# Patient Record
Sex: Female | Born: 1960 | Race: White | Hispanic: No | Marital: Married | Smoking: Never smoker
Health system: Southern US, Community
[De-identification: ages and names within clinical notes are randomized; demographics above are authoritative.]

## PROBLEM LIST (undated history)

## (undated) DIAGNOSIS — F329 Major depressive disorder, single episode, unspecified: Secondary | ICD-10-CM

## (undated) DIAGNOSIS — F32A Depression, unspecified: Secondary | ICD-10-CM

## (undated) DIAGNOSIS — E7800 Pure hypercholesterolemia, unspecified: Secondary | ICD-10-CM

## (undated) HISTORY — PX: OTHER SURGICAL HISTORY: SHX169

---

## 2008-02-20 ENCOUNTER — Emergency Department (HOSPITAL_BASED_OUTPATIENT_CLINIC_OR_DEPARTMENT_OTHER): Admission: EM | Admit: 2008-02-20 | Discharge: 2008-02-20 | Payer: Self-pay | Admitting: Emergency Medicine

## 2011-01-25 LAB — COMPREHENSIVE METABOLIC PANEL
ALT: 10
Alkaline Phosphatase: 65
CO2: 30
Chloride: 103
GFR calc non Af Amer: >60
Glucose, Bld: 80
Potassium: 3.3 — ABNORMAL LOW
Sodium: 141
Total Protein: 7.6

## 2011-01-25 LAB — CBC
HCT: 42.1
Hemoglobin: 14.5
MCHC: 34.5
MCV: 89.7
Platelets: 211
RBC: 4.7
RDW: 13.1
WBC: 8.4

## 2011-01-25 LAB — POCT CARDIAC MARKERS
CKMB, poc: <1 — ABNORMAL LOW
CKMB, poc: <1 — ABNORMAL LOW
Myoglobin, poc: 19.4
Myoglobin, poc: 38.1
Troponin i, poc: <0.05

## 2011-01-25 LAB — ETHANOL: Alcohol, Ethyl (B): <10

## 2011-01-25 LAB — DIFFERENTIAL
Basophils Absolute: 0.1
Basophils Relative: 1
Eosinophils Absolute: 0
Eosinophils Relative: 0
Lymphocytes Relative: 23
Lymphs Abs: 1.9
Monocytes Absolute: 0.4
Monocytes Relative: 4
Neutro Abs: 6
Neutrophils Relative %: 71

## 2011-01-25 LAB — POCT TOXICOLOGY PANEL

## 2012-02-03 ENCOUNTER — Emergency Department (HOSPITAL_BASED_OUTPATIENT_CLINIC_OR_DEPARTMENT_OTHER)
Admission: EM | Admit: 2012-02-03 | Discharge: 2012-02-03 | Disposition: A | Discharge status: Home / Self Care | Payer: BC Managed Care – PPO | Attending: Emergency Medicine | Admitting: Emergency Medicine

## 2012-02-03 ENCOUNTER — Encounter (HOSPITAL_BASED_OUTPATIENT_CLINIC_OR_DEPARTMENT_OTHER): Payer: Self-pay | Admitting: *Deleted

## 2012-02-03 ENCOUNTER — Emergency Department (HOSPITAL_BASED_OUTPATIENT_CLINIC_OR_DEPARTMENT_OTHER): Payer: BC Managed Care – PPO

## 2012-02-03 DIAGNOSIS — E7800 Pure hypercholesterolemia, unspecified: Secondary | ICD-10-CM | POA: Insufficient documentation

## 2012-02-03 DIAGNOSIS — R112 Nausea with vomiting, unspecified: Secondary | ICD-10-CM | POA: Insufficient documentation

## 2012-02-03 DIAGNOSIS — F329 Major depressive disorder, single episode, unspecified: Secondary | ICD-10-CM | POA: Insufficient documentation

## 2012-02-03 DIAGNOSIS — F3289 Other specified depressive episodes: Secondary | ICD-10-CM | POA: Insufficient documentation

## 2012-02-03 DIAGNOSIS — R109 Unspecified abdominal pain: Secondary | ICD-10-CM

## 2012-02-03 DIAGNOSIS — R1084 Generalized abdominal pain: Secondary | ICD-10-CM | POA: Insufficient documentation

## 2012-02-03 HISTORY — DX: Major depressive disorder, single episode, unspecified: F32.9

## 2012-02-03 HISTORY — DX: Pure hypercholesterolemia, unspecified: E78.00

## 2012-02-03 HISTORY — DX: Depression, unspecified: F32.A

## 2012-02-03 LAB — CBC WITH DIFFERENTIAL/PLATELET
Basophils Absolute: 0 10*3/uL (ref 0.0–0.1)
Lymphocytes Relative: 24 % (ref 12–46)
Neutro Abs: 5.8 10*3/uL (ref 1.7–7.7)
Neutrophils Relative %: 70 % (ref 43–77)
Platelets: 295 10*3/uL (ref 150–400)
RDW: 13 % (ref 11.5–15.5)
WBC: 8.3 10*3/uL (ref 4.0–10.5)

## 2012-02-03 LAB — COMPREHENSIVE METABOLIC PANEL
ALT: 19 U/L (ref 0–35)
AST: 21 U/L (ref 0–37)
Albumin: 4.4 g/dL (ref 3.5–5.2)
CO2: 31 mEq/L (ref 19–32)
Chloride: 98 mEq/L (ref 96–112)
GFR calc non Af Amer: >90 mL/min (ref >90)
Sodium: 141 mEq/L (ref 135–145)
Total Bilirubin: 0.5 mg/dL (ref 0.3–1.2)

## 2012-02-03 LAB — URINALYSIS, ROUTINE W REFLEX MICROSCOPIC
Bilirubin Urine: NEGATIVE
Specific Gravity, Urine: 1.011 (ref 1.005–1.030)
Urobilinogen, UA: 0.2 mg/dL (ref 0.0–1.0)
pH: 8 (ref 5.0–8.0)

## 2012-02-03 LAB — URINE MICROSCOPIC-ADD ON

## 2012-02-03 MED ORDER — ONDANSETRON HCL 4 MG/2ML IJ SOLN
4.0000 mg | Freq: Once | INTRAMUSCULAR | Status: AC
  Administered 2012-02-03: 4 mg via INTRAVENOUS
  Filled 2012-02-03: qty 2

## 2012-02-03 MED ORDER — SODIUM CHLORIDE 0.9 % IV BOLUS (SEPSIS)
1000.0000 mL | Freq: Once | INTRAVENOUS | Status: AC
  Administered 2012-02-03: 1000 mL via INTRAVENOUS

## 2012-02-03 MED ORDER — MAGNESIUM CITRATE PO SOLN: 296.0000 mL | Freq: Once | ORAL | Status: AC

## 2012-02-03 MED ORDER — MORPHINE SULFATE 4 MG/ML IJ SOLN
2.0000 mg | Freq: Once | INTRAMUSCULAR | Status: AC
  Administered 2012-02-03: 2 mg via INTRAVENOUS
  Filled 2012-02-03: qty 1

## 2012-02-03 MED ORDER — IOHEXOL 300 MG/ML  SOLN
100.0000 mL | Freq: Once | INTRAMUSCULAR | Status: AC | PRN
  Administered 2012-02-03: 100 mL via INTRAVENOUS

## 2012-02-03 MED ORDER — PROMETHAZINE HCL 25 MG PO TABS: 25.0000 mg | ORAL_TABLET | Freq: Four times a day (QID) | ORAL | Status: AC | PRN

## 2012-02-03 MED ORDER — IOHEXOL 300 MG/ML  SOLN
25.0000 mL | INTRAMUSCULAR | Status: AC
  Administered 2012-02-03 (×2): 25 mL via ORAL

## 2012-02-03 NOTE — ED Notes (Signed)
Patient transported to CT 

## 2012-02-03 NOTE — ED Notes (Signed)
Abdominal pain. Was sent from her MDs office with constipation. Xray showed possible ileus.

## 2012-02-03 NOTE — ED Provider Notes (Signed)
History     CSN: [id number]  Arrival date & time 02/03/12  1722   First MD Initiated Contact with Patient 02/03/12 1901      Chief Complaint  Patient presents with  . Abdominal Pain    (Consider location/radiation/quality/duration/timing/severity/associated sxs/prior treatment) HPI Comments: Patient is an otherwise healthy 51 year old female who presents with abdominal pain that started about 1 week ago. Patient reports a gradual onset of pain that is progressively worsening. She describes the pain as discomfort and fullness. The pain is generalized to her abdomen and does not radiate. She reports associated nausea and vomiting -- she reports vomiting 9 times today. She reports having scant bowel movements and feels constipated. Nothing makes the pain better or worse. She denies fever, chest pain, SOB, headache, back pain, dysuria.   Patient is a 51 y.o. female presenting with abdominal pain.  Abdominal Pain The primary symptoms of the illness include abdominal pain, nausea and vomiting.    Past Medical History  Diagnosis Date  . Depression   . High cholesterol     Past Surgical History  Procedure Date  . C sections     No family history on file.  History  Substance Use Topics  . Smoking status: Never Smoker   . Smokeless tobacco: Not on file  . Alcohol Use: No    OB History    Grav Para Term Preterm Abortions TAB SAB Ect Mult Living                  Review of Systems  Constitutional: Positive for appetite change.  Gastrointestinal: Positive for nausea, vomiting and abdominal pain.  All other systems reviewed and are negative.    Allergies  Review of patient's allergies indicates no known allergies.  Home Medications  No current outpatient prescriptions on file.  BP 173/93  Pulse 72  Temp 98.1 F (36.7 C) (Oral)  Resp 20  SpO2 100%  Physical Exam  Nursing note and vitals reviewed. Constitutional: She is oriented to person, place, and time. She  appears well-developed and well-nourished. No distress.  HENT:  Head: Normocephalic and atraumatic.  Mouth/Throat: No oropharyngeal exudate.  Eyes: Conjunctivae normal and EOM are normal. No scleral icterus.  Neck: Normal range of motion. Neck supple.  Cardiovascular: Normal rate and regular rhythm.  Exam reveals no gallop and no friction rub.   No murmur heard. Pulmonary/Chest: Effort normal and breath sounds normal. She has no wheezes. She has no rales. She exhibits no tenderness.  Abdominal: Soft. She exhibits no distension. There is tenderness. There is no rebound and no guarding.       Generalized tenderness to palpation. No peritoneal signs.   Musculoskeletal: Normal range of motion.  Neurological: She is alert and oriented to person, place, and time. Coordination normal.       Speech is goal-oriented. Moves limbs without ataxia.   Skin: Skin is warm and dry. She is not diaphoretic.  Psychiatric: She has a normal mood and affect. Her behavior is normal.    ED Course  Procedures (including critical care time)  Labs Reviewed  URINALYSIS, ROUTINE W REFLEX MICROSCOPIC - Abnormal; Notable for the following:    APPearance TURBID (*)     Hgb urine dipstick SMALL (*)     Ketones, ur 15 (*)     Leukocytes, UA MODERATE (*)     All other components within normal limits  URINE MICROSCOPIC-ADD ON - Abnormal; Notable for the following:    Squamous  Epithelial / LPF FEW (*)     Bacteria, UA FEW (*)     All other components within normal limits  COMPREHENSIVE METABOLIC PANEL - Abnormal; Notable for the following:    Glucose, Bld 108 (*)     Calcium 10.7 (*)     All other components within normal limits  CBC WITH DIFFERENTIAL   Ct Abdomen Pelvis W Contrast  02/03/2012  *RADIOLOGY REPORT*  Clinical Data: Right lower quadrant pain.  Vomiting.  Mild leukocytosis.  CT ABDOMEN AND PELVIS WITH CONTRAST  Technique:  Multidetector CT imaging of the abdomen and pelvis was performed following the  standard protocol during bolus administration of intravenous contrast.  Contrast: OMNIPAQUE IOHEXOL 300 MG/ML  SOLN  Comparison: None.  Findings: Appendix is not well visualized, however there is no evidence of inflammatory process in the area the cecum were elsewhere within the abdomen or pelvis.  Mild sigmoid diverticulosis is noted, however there is no evidence of diverticulitis.  Uterus and adnexa are unremarkable. No abnormal fluid collections are identified.  A nonobstructing 3 mm calculus is seen in the lower pole of the left kidney.  No evidence of ureteral calculi or hydronephrosis. No bladder calculi identified.  A small renal cysts are noted bilaterally but there is no evidence of renal mass.  A heterogeneous mass is seen in the posterior right hepatic lobe measuring approximately centimeters.  This is nonspecific features. No other liver masses are identified.  The gallbladder, pancreas, spleen, and adrenal glands are normal in appearance.  IMPRESSION:  1.  No evidence of appendicitis or other acute findings. 2.  Tiny nonobstructing left renal calculus. 3.  Mild sigmoid diverticulosis.  No radiographic evidence of diverticulitis. 4.  3 cm indeterminate mass in the posterior segment right hepatic lobe. Non-emergent abdomen MRI without and with contrast is recommended for further characterization. This recommendation follows ACR consensus guidelines:  Managing Incidental Findings on Abdominal CT:  White Paper of the ACR Incidental Findings Committee.  J Am Coll Radiol 1914;7:829-562   Original Report Authenticated By: Danae Orleans, M.D.      1. Abdominal pain       MDM  9:38 PM Patient given morphine and zofran for pain and nausea. CT scan shows no evidence of obstruction, infectious process or any other acute abnormality.   9:58 PM Patient is feeling better and is ready to go home. No further evaluation needed at this time. I will discharge her with Magnesium citrate for constipation  and phenergan for nausea. She has been instructed to follow up with her PCP about the incidental liver mass found on CT. She should follow up with her PCP for further evaluation. She should return to the ED with worsening or concerning symptoms. Patient is currently stable.  Filed Vitals:   02/03/12 2000  BP: 173/93  Pulse: 72  Temp:   Resp:          Emilia Beck, PA-C 02/03/12 2213

## 2012-02-03 NOTE — ED Provider Notes (Signed)
Medical screening examination/treatment/procedure(s) were performed by non-physician practitioner and as supervising physician I was immediately available for consultation/collaboration.   Rolan Bucco, MD 02/03/12 [phone]

## 2013-08-18 ENCOUNTER — Emergency Department (HOSPITAL_BASED_OUTPATIENT_CLINIC_OR_DEPARTMENT_OTHER)
Admission: EM | Admit: 2013-08-18 | Discharge: 2013-08-19 | Disposition: A | Discharge status: Home / Self Care | Payer: BC Managed Care – PPO | Attending: Emergency Medicine | Admitting: Emergency Medicine

## 2013-08-18 ENCOUNTER — Emergency Department (HOSPITAL_BASED_OUTPATIENT_CLINIC_OR_DEPARTMENT_OTHER): Payer: BC Managed Care – PPO

## 2013-08-18 ENCOUNTER — Encounter (HOSPITAL_BASED_OUTPATIENT_CLINIC_OR_DEPARTMENT_OTHER): Payer: Self-pay | Admitting: Emergency Medicine

## 2013-08-18 DIAGNOSIS — Z8639 Personal history of other endocrine, nutritional and metabolic disease: Secondary | ICD-10-CM | POA: Insufficient documentation

## 2013-08-18 DIAGNOSIS — R1012 Left upper quadrant pain: Secondary | ICD-10-CM | POA: Insufficient documentation

## 2013-08-18 DIAGNOSIS — N390 Urinary tract infection, site not specified: Secondary | ICD-10-CM | POA: Insufficient documentation

## 2013-08-18 DIAGNOSIS — Z862 Personal history of diseases of the blood and blood-forming organs and certain disorders involving the immune mechanism: Secondary | ICD-10-CM | POA: Insufficient documentation

## 2013-08-18 DIAGNOSIS — R079 Chest pain, unspecified: Secondary | ICD-10-CM

## 2013-08-18 DIAGNOSIS — R0789 Other chest pain: Secondary | ICD-10-CM | POA: Insufficient documentation

## 2013-08-18 DIAGNOSIS — Z8659 Personal history of other mental and behavioral disorders: Secondary | ICD-10-CM | POA: Insufficient documentation

## 2013-08-18 LAB — D-DIMER, QUANTITATIVE: D-Dimer, Quant: 0.31 ug/mL-FEU (ref 0.00–0.48)

## 2013-08-18 LAB — CBC
HEMATOCRIT: 42.4 % (ref 36.0–46.0)
Hemoglobin: 14.3 g/dL (ref 12.0–15.0)
MCH: 30.9 pg (ref 26.0–34.0)
MCHC: 33.7 g/dL (ref 30.0–36.0)
MCV: 91.6 fL (ref 78.0–100.0)
PLATELETS: 276 10*3/uL (ref 150–400)
RBC: 4.63 MIL/uL (ref 3.87–5.11)
RDW: 13.3 % (ref 11.5–15.5)
WBC: 8.3 10*3/uL (ref 4.0–10.5)

## 2013-08-18 NOTE — ED Provider Notes (Signed)
CSN: [id number]     Arrival date & time 08/18/13  2246 History  This chart was scribed for Enid SkeensJoshua M Derrian Rodak, MD by Danella Maiersaroline Early, ED Scribe. This patient was seen in room MH03/MH03 and the patient's care was started at 11:03 PM.    Chief Complaint  Patient presents with  . Chest Pain   The history is provided by the patient. No language interpreter was used.   HPI Comments: Ann Harrington is a 53 y.o. female with a h/o hypercholesterolemia who presents to the Emergency Department complaining of sharp left-sided CP with movement, palpation and deep breathing onset last night that worsened tonight. She describes the pain as "like a knife". She denies prior h/o CP. No family h/o early cardiac death. She is not on any medications regularly. No h/o DVT or PE, hernias, or cancer. She denies recent travels or long car trips. Pt denies nausea, vomiting, diaphoresis, abdominal pain, HA, blurred vision, rashes, leg swelling, hemoptysis.   Past Medical History  Diagnosis Date  . Depression   . High cholesterol    Past Surgical History  Procedure Laterality Date  . C sections     No family history on file. History  Substance Use Topics  . Smoking status: Never Smoker   . Smokeless tobacco: Not on file  . Alcohol Use: No   OB History   Grav Para Term Preterm Abortions TAB SAB Ect Mult Living                 Review of Systems  Constitutional: Negative for diaphoresis.  Eyes: Negative for visual disturbance.  Cardiovascular: Positive for chest pain. Negative for leg swelling.  Gastrointestinal: Negative for nausea and vomiting.  Skin: Negative for rash.  Neurological: Negative for headaches.  All other systems reviewed and are negative.     Allergies  Review of patient's allergies indicates no known allergies.  Home Medications   Prior to Admission medications   Medication Sig Start Date End Date Taking? Authorizing Provider  magnesium citrate solution Take 296 mLs by mouth  once. OTC 02/03/12   Kaitlyn Szekalski, PA-C  promethazine (PHENERGAN) 25 MG tablet Take 1 tablet (25 mg total) by mouth every 6 (six) hours as needed for nausea. 02/03/12   Kaitlyn Szekalski, PA-C   BP 144/93  Pulse 84  Temp(Src) 98.3 F (36.8 C) (Oral)  Resp 20  Ht 4\' 10"  (1.473 m)  Wt 150 lb (68.04 kg)  BMI 31.36 kg/m2  SpO2 99% Physical Exam  Nursing note and vitals reviewed. Constitutional: She is oriented to person, place, and time. She appears well-developed and well-nourished. No distress.  HENT:  Head: Normocephalic and atraumatic.  Mouth/Throat: Oropharynx is clear and moist.  Eyes: Conjunctivae and EOM are normal. Pupils are equal, round, and reactive to light.  Neck: Neck supple. No tracheal deviation present.  Cardiovascular: Normal rate, regular rhythm and normal heart sounds.  Exam reveals no gallop and no friction rub.   No murmur heard. Pulmonary/Chest: Effort normal and breath sounds normal. No respiratory distress. She has no wheezes. She has no rales. She exhibits tenderness (anterior ribs).  Pain with dep breathing  Abdominal: Soft. There is tenderness. There is no guarding.  Mild tenderness LUQ abdominal and left lower anterior chest with palpation  Musculoskeletal: Normal range of motion. She exhibits no edema.  Equal pulses BLE. No tenderness to back of calves.   Neurological: She is alert and oriented to person, place, and time.  Skin: Skin is warm and  dry. No rash noted.  Psychiatric: She has a normal mood and affect. Her behavior is normal.    ED Course  Procedures (including critical care time) Medications  ketorolac (TORADOL) 15 MG/ML injection 15 mg (15 mg Intravenous Given 08/19/13 0014)  fentaNYL (SUBLIMAZE) injection 50 mcg (50 mcg Intravenous Given 08/19/13 0014)    DIAGNOSTIC STUDIES: Oxygen Saturation is 99% on RA, normal by my interpretation.    COORDINATION OF CARE: 11:27 PM- Discussed treatment plan with pt. Pt agrees to plan.  12:43  AM - Rechecked pt who states pain has improved but still present with deep breathing and movement/ palpation. Discussed test results which were all normal. Plan for second troponin and UA. Reproducible chest wall pain anterior left side. Abdomen on recheck is nontender, soft. Discussed likelihood that pain is muscular. Pt agrees to plan.     Labs Review Labs Reviewed  COMPREHENSIVE METABOLIC PANEL - Abnormal; Notable for the following:    Glucose, Bld 108 (*)    Total Bilirubin <0.2 (*)    GFR calc non Af Amer 73 (*)    GFR calc Af Amer 84 (*)    All other components within normal limits  URINALYSIS, ROUTINE W REFLEX MICROSCOPIC - Abnormal; Notable for the following:    APPearance TURBID (*)    Leukocytes, UA LARGE (*)    All other components within normal limits  URINE MICROSCOPIC-ADD ON - Abnormal; Notable for the following:    Bacteria, UA FEW (*)    All other components within normal limits  URINE CULTURE  CBC  TROPONIN I  D-DIMER, QUANTITATIVE  TROPONIN I  LIPASE, BLOOD    Imaging Review Dg Chest 2 View  08/18/2013   CLINICAL DATA:  Left-sided chest pain, worse with movement.  EXAM: CHEST  2 VIEW  COMPARISON:  None.  FINDINGS: The lungs are well-aerated. Minimal left basilar opacity likely reflects atelectasis. There is no evidence of pleural effusion or pneumothorax.  The heart is normal in size; the mediastinal contour is within normal limits. No acute osseous abnormalities are seen.  IMPRESSION: Minimal left basilar opacity likely reflects atelectasis; lungs otherwise clear.   Electronically Signed   By: Roanna Raider M.D.   On: 08/18/2013 23:19     EKG Interpretation   Date/Time:  Sunday August 18 2013 22:51:34 EDT Ventricular Rate:  78 PR Interval:  146 QRS Duration: 88 QT Interval:  366 QTC Calculation: 417 R Axis:   87 Text Interpretation:  Normal sinus rhythm Normal ECG Confirmed by Saim Almanza   MD, Niasha Devins ([id number]) on 08/19/2013 12:41:56 AM      MDM   Final  diagnoses:  Chest pain  UTI (lower urinary tract infection)   I personally performed the services described in this documentation, which was scribed in my presence. The recorded information has been reviewed and is accurate.  Well-appearing healthy female with cholesterol is only risk factor for cardiac and no risk factors for pulmonary embolism. Pain is reproducible on multiple exams. Patient has exquisite tenderness to left lower anterior ribs. Patient does exercise regularly with significant other. D-dimer done for low risk pulmonary embolism and negative, no indication for CT scan. Delta troponin done for low risk cardiac negative and no reason for admission, discussed close outpatient followup and possible stress test if this atypical chest pain does not improve. Urine done in case pain related to kidney stone and no hematuria, mild UTI. Cipro and culture done with outpatient followup. Patient improved on recheck. No flank pain  on exam.  Results and differential diagnosis were discussed with the patient. Close follow up outpatient was discussed, patient comfortable with the plan.   Filed Vitals:   08/18/13 2257  BP: 144/93  Pulse: 84  Temp: 98.3 F (36.8 C)  TempSrc: Oral  Resp: 20  Height: 4\' 10"  (1.473 m)  Weight: 150 lb (68.04 kg)  SpO2: 99%    \   Enid SkeensJoshua M Jaquari Reckner, MD 08/19/13 [phone]

## 2013-08-18 NOTE — ED Notes (Signed)
C/o onset of left sided chest pain that is worsened with movement.  Denies nausea, vomiting, or diaphoresis.  No prior history of symptoms like this.

## 2013-08-19 LAB — URINE MICROSCOPIC-ADD ON

## 2013-08-19 LAB — COMPREHENSIVE METABOLIC PANEL
ALBUMIN: 4 g/dL (ref 3.5–5.2)
ALK PHOS: 97 U/L (ref 39–117)
ALT: 20 U/L (ref 0–35)
AST: 19 U/L (ref 0–37)
BUN: 14 mg/dL (ref 6–23)
CHLORIDE: 103 meq/L (ref 96–112)
CO2: 32 meq/L (ref 19–32)
Calcium: 9.8 mg/dL (ref 8.4–10.5)
Creatinine, Ser: 0.9 mg/dL (ref 0.50–1.10)
GFR calc Af Amer: 84 mL/min — ABNORMAL LOW (ref >90)
GFR calc non Af Amer: 73 mL/min — ABNORMAL LOW (ref >90)
Glucose, Bld: 108 mg/dL — ABNORMAL HIGH (ref 70–99)
POTASSIUM: 3.9 meq/L (ref 3.7–5.3)
Sodium: 146 mEq/L (ref 137–147)
Total Protein: 7.4 g/dL (ref 6.0–8.3)

## 2013-08-19 LAB — URINALYSIS, ROUTINE W REFLEX MICROSCOPIC
BILIRUBIN URINE: NEGATIVE
Glucose, UA: NEGATIVE mg/dL
Hgb urine dipstick: NEGATIVE
KETONES UR: NEGATIVE mg/dL
NITRITE: NEGATIVE
Protein, ur: NEGATIVE mg/dL
SPECIFIC GRAVITY, URINE: 1.018 (ref 1.005–1.030)
UROBILINOGEN UA: 0.2 mg/dL (ref 0.0–1.0)
pH: 8 (ref 5.0–8.0)

## 2013-08-19 LAB — TROPONIN I
Troponin I: <0.3 ng/mL (ref <0.30)
Troponin I: <0.3 ng/mL (ref <0.30)

## 2013-08-19 LAB — LIPASE, BLOOD: LIPASE: 38 U/L (ref 11–59)

## 2013-08-19 MED ORDER — KETOROLAC TROMETHAMINE 15 MG/ML IJ SOLN
15.0000 mg | Freq: Once | INTRAMUSCULAR | Status: AC
  Administered 2013-08-19: 15 mg via INTRAVENOUS
  Filled 2013-08-19: qty 1

## 2013-08-19 MED ORDER — CIPROFLOXACIN HCL 250 MG PO TABS: 250.0000 mg | ORAL_TABLET | Freq: Two times a day (BID) | ORAL | Status: AC

## 2013-08-19 MED ORDER — FENTANYL CITRATE 0.05 MG/ML IJ SOLN
50.0000 ug | Freq: Once | INTRAMUSCULAR | Status: AC
  Administered 2013-08-19: 50 ug via INTRAVENOUS
  Filled 2013-08-19: qty 2

## 2013-08-19 NOTE — Discharge Instructions (Signed)
If you were given medicines take as directed.  If you are on coumadin or contraceptives realize their levels and effectiveness is altered by many different medicines.  If you have any reaction (rash, tongues swelling, other) to the medicines stop taking and see a physician.   If no improvement in symptoms discussed CT scan and or stress test with her primary doctor or return to the ER. Please follow up as directed and return to the ER or see a physician for new or worsening symptoms.  Thank you. Filed Vitals:   08/18/13 2257  BP: 144/93  Pulse: 84  Temp: 98.3 F (36.8 C)  TempSrc: Oral  Resp: 20  Height: 4\' 10"  (1.473 m)  Weight: 150 lb (68.04 kg)  SpO2: 99%

## 2013-08-20 LAB — URINE CULTURE: Colony Count: 80000

## 2013-08-21 NOTE — Progress Notes (Signed)
ED Antimicrobial Stewardship Positive Culture Follow Up   Evaline Cotham is an 53 y.o. female who [facility] with a chief complaint of CP Chief Complaint  Patient presents with  . Chest Pain    Recent Results (from the past 720 hour(s))  URINE CULTURE     Status: None   Collection Time    08/19/13 12:56 AM      Result Value Ref Range Status   Specimen Description URINE, CLEAN CATCH   Final   Special Requests NONE   Final   Culture  Setup Time     Final   Value: 08/19/2013 09:58     Performed at [facility]   Colony Count     Final   Value: 80,000 COLONIES/ML     [facility]formed at [facility]   Culture     Final   Value: VIRIDANS STREPTOCOCCUS     Performed at [facility]   Report Status 08/20/2013 FINAL   Final    [x]  Treated with Cipro, organism not covered by prescribed antimicrobial  52 YOF who presented to the [facility] with c/o CP. Cardiac work-up was negative however work-up did show a mild UTI and the patient was sent home on Cipro. Cx grew viridans Strep which is not covered by Cipro. The patient has no red flags for IE - so will treat solely for UTI.  New antibiotic prescription: Keflex 500 mg bid x 7 days  ED Provider: Mellody DrownLauren Parker, PA-C  Ann HeldElizabeth J Keiasia Christianson 08/21/2013, 9:20 AM Infectious Diseases Pharmacist Phone# [phone]

## 2013-08-23 ENCOUNTER — Telehealth (HOSPITAL_BASED_OUTPATIENT_CLINIC_OR_DEPARTMENT_OTHER): Payer: Self-pay | Admitting: Emergency Medicine

## 2013-08-23 NOTE — Telephone Encounter (Signed)
Post ED Visit - Positive Culture Follow-up: Successful Patient Follow-Up  Culture assessed and recommendations reviewed by: []  Wes Dulaney, Pharm.D., BCPS []  Celedonio MiyamotoJeremy Frens, Pharm.D., BCPS [x]  Georgina PillionElizabeth Martin, Pharm.D., BCPS []  [place] Green[place]h Pham, Vermont[place]arm.D., BCPS, AAHIVP []  Estella HuskMichelle Turner, Pharm.D., BCPS, AAHIVP  Positive urine culture  []  Patient discharged without antimicrobial prescription and treatment is now indicated [x]  Organism is resistant to prescribed ED discharge antimicrobial []  Patient with positive blood cultures  Changes discussed with ED provider: Mellody DrownLauren Parker PA-C New antibiotic prescription: Keflex 500 mg BID x 7 days     [facility] Briceyda Abdullah 08/23/2013, 2:25 PM

## 2013-08-26 NOTE — Telephone Encounter (Signed)
Rx fr Keflex 500 mg PO BID x 7 days, prescribed by Mellody DrownLauren Parker PA-C, called in to [facility] in [place] [phone]) by Norm ParcelShannon Gammons PFM.

## 2013-09-29 IMAGING — CT CT ABD-PELV W/ CM
2 of 5 series · 16 of 46 positions shown, 18 images · IV contrast (APPLIED)
Comparison: None.

CLINICAL DATA: Right lower quadrant pain.  Vomiting.  Mild
leukocytosis.

CT ABDOMEN AND PELVIS WITH CONTRAST
TECHNIQUE: Multidetector CT imaging of the abdomen and pelvis was
performed following the standard protocol during bolus
administration of intravenous contrast.
Contrast: 100mL OMNIPAQUE IOHEXOL 300 MG/ML  SOLN

[Series 2: abd/pelvis 5.0 b31f · axial · 0.70mm/px · z∈[-425,-50]mm · 13 of 85 slices shown, 15 images]
[im 5/85  soft-tissue]
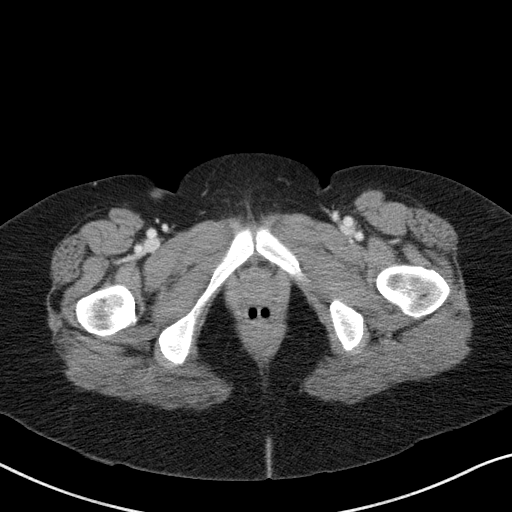
[im 5/85  bone]
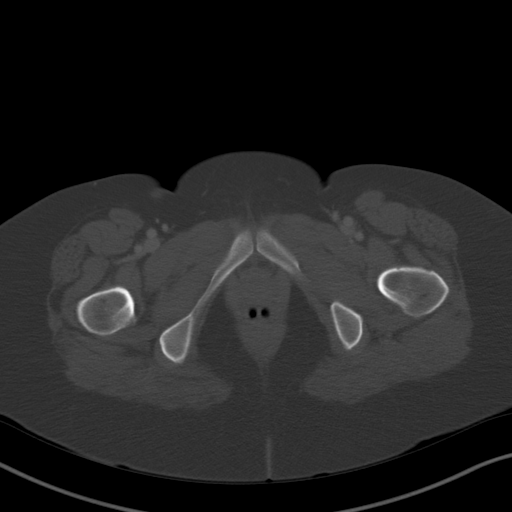
[im 14/85  soft-tissue]
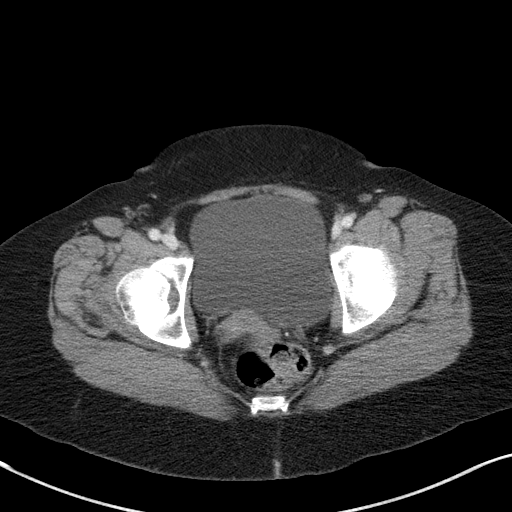
[im 18/85  soft-tissue]
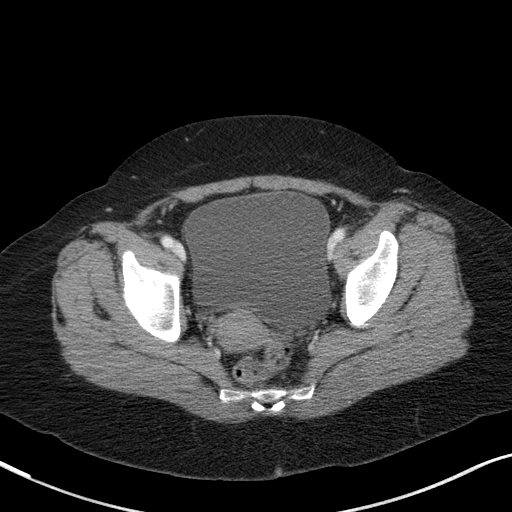
[im 23/85  soft-tissue]
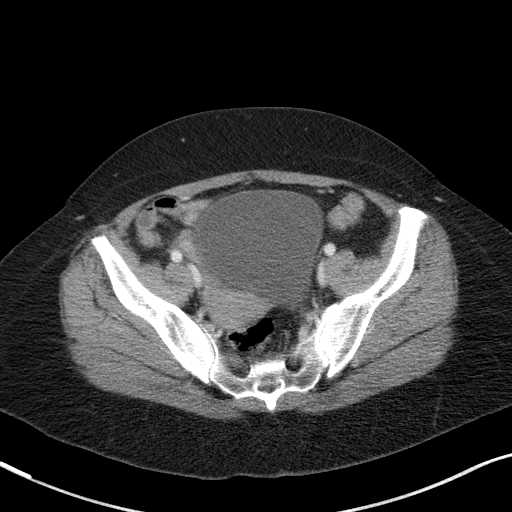
[im 31/85  soft-tissue]
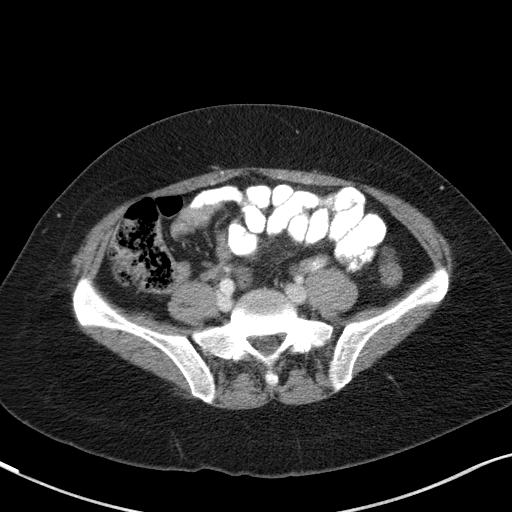
[im 36/85  soft-tissue]
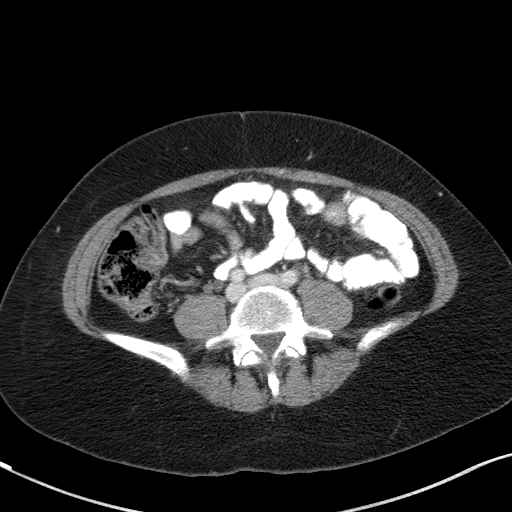
[im 45/85  soft-tissue]
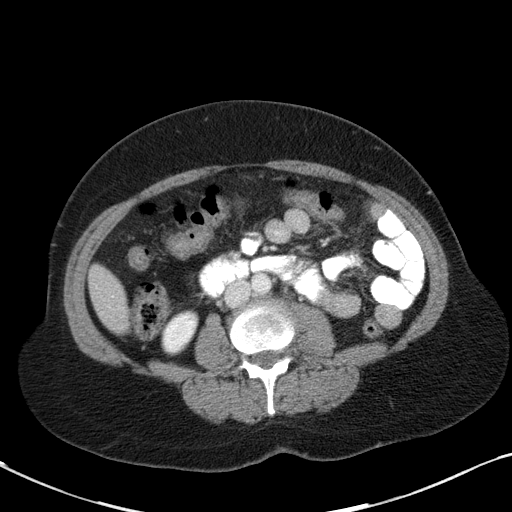
[im 49/85  soft-tissue]
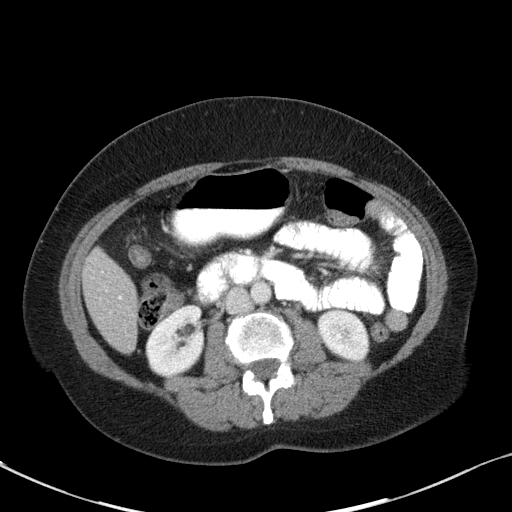
[im 54/85  soft-tissue]
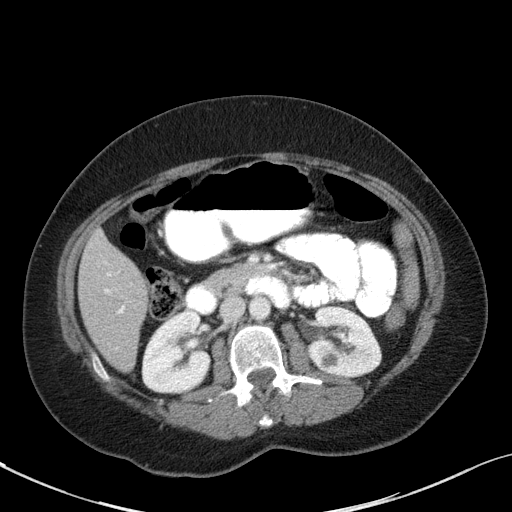
[im 54/85  bone]
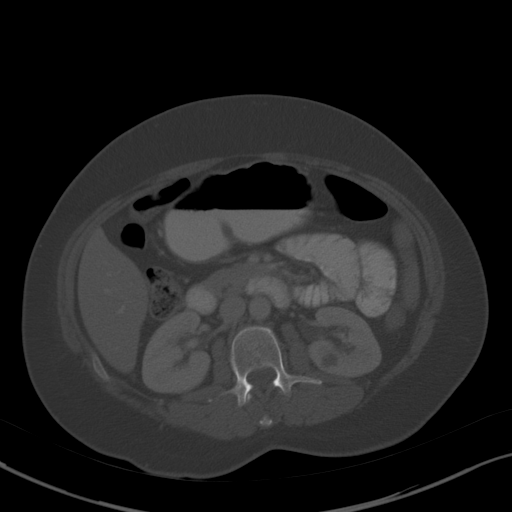
[im 62/85  soft-tissue]
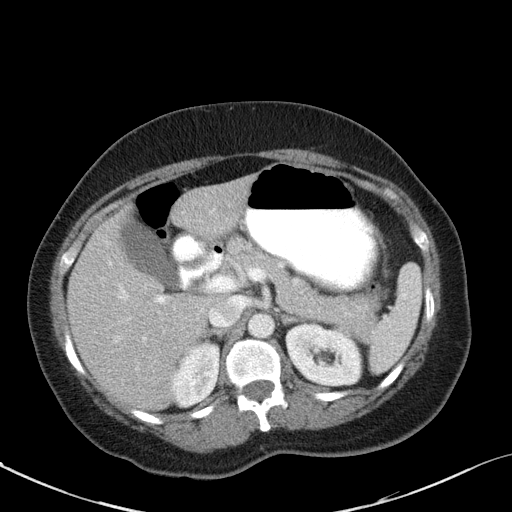
[im 67/85  soft-tissue]
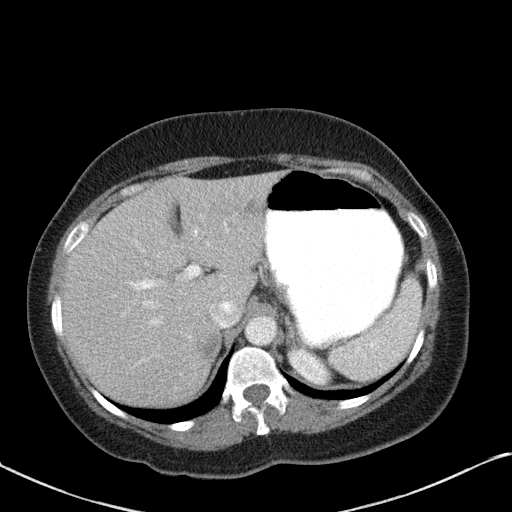
[im 71/85  soft-tissue]
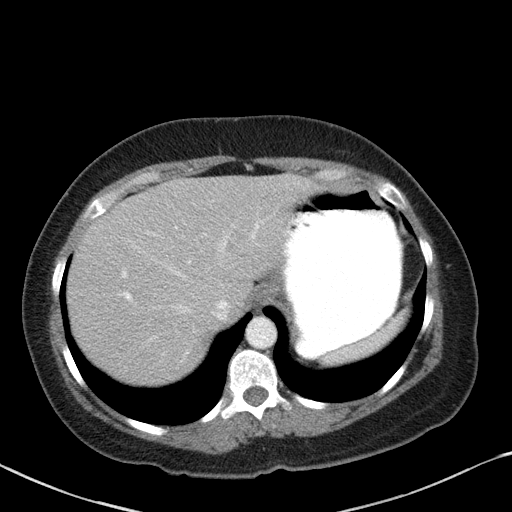
[im 80/85  soft-tissue]
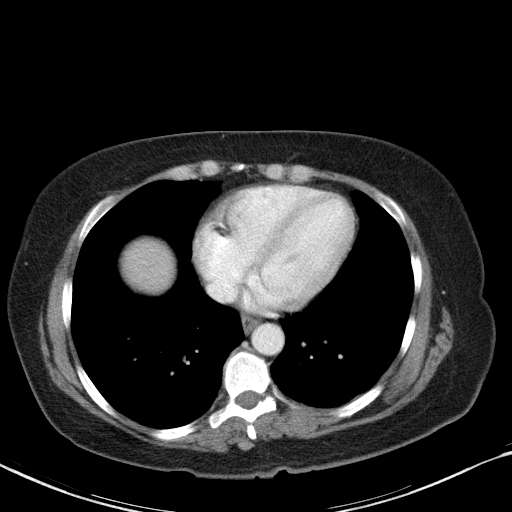

[Series 5: abd/pelvis 3.0 coronal · coronal · 0.77mm/px · 3 of 69 slices shown]
[im 23/69  soft-tissue]
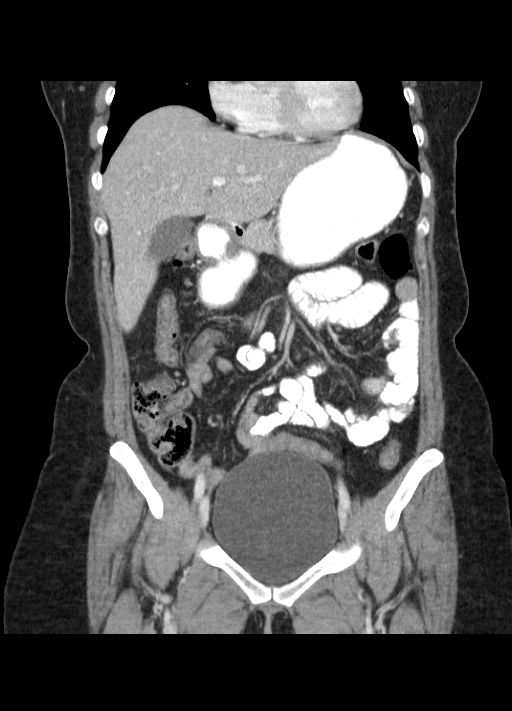
[im 31/69  soft-tissue]
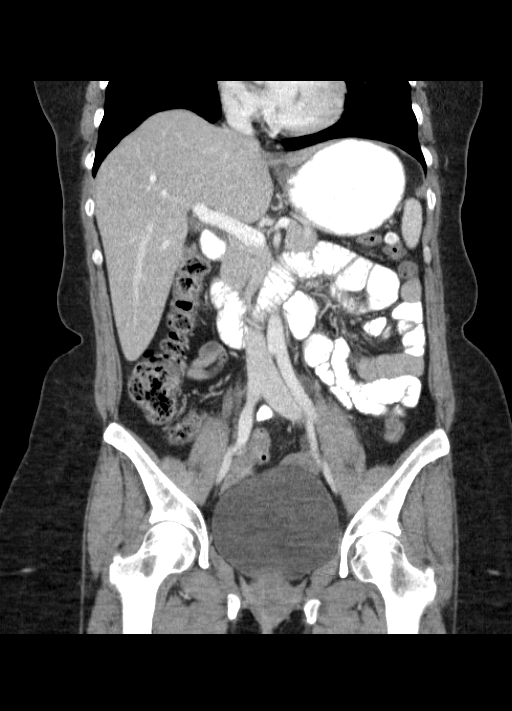
[im 38/69  soft-tissue]
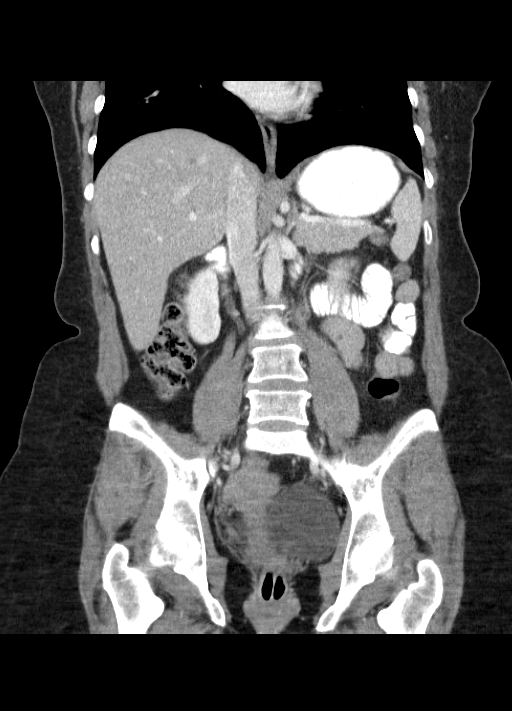

[16 of 46 positions shown; findings below may reference images not displayed]

FINDINGS: Appendix is not well visualized, however there is no
evidence of inflammatory process in the area the cecum were
elsewhere within the abdomen or pelvis.  Mild sigmoid
diverticulosis is noted, however there is no evidence of
diverticulitis.  Uterus and adnexa are unremarkable. No abnormal
fluid collections are identified.

A nonobstructing 3 mm calculus is seen in the lower pole of the
left kidney.  No evidence of ureteral calculi or hydronephrosis.
No bladder calculi identified.  A small renal cysts are noted
bilaterally but there is no evidence of renal mass.

A heterogeneous mass is seen in the posterior right hepatic lobe
measuring approximately centimeters.  This is nonspecific features.
No other liver masses are identified.  The gallbladder, pancreas,
spleen, and adrenal glands are normal in appearance.
IMPRESSION: 1.  No evidence of appendicitis or other acute findings.
2.  Tiny nonobstructing left renal calculus.
3.  Mild sigmoid diverticulosis.  No radiographic evidence of
diverticulitis.
4.  3 cm indeterminate mass in the posterior segment right hepatic
lobe. Non-emergent abdomen MRI without and with contrast is
recommended for further characterization. This recommendation
follows ACR consensus guidelines:  Managing Incidental Findings on
Abdominal CT:  White Paper of the ACR Incidental Findings
Committee.  [HOSPITAL] 2999;[DATE]

## 2015-04-14 IMAGING — CR DG CHEST 2V
2 series · 2 of 2 positions shown · non-contrast
Comparison: None.

CLINICAL DATA: Left-sided chest pain, worse with movement.

EXAM:
CHEST  2 VIEW

[w chest pa]
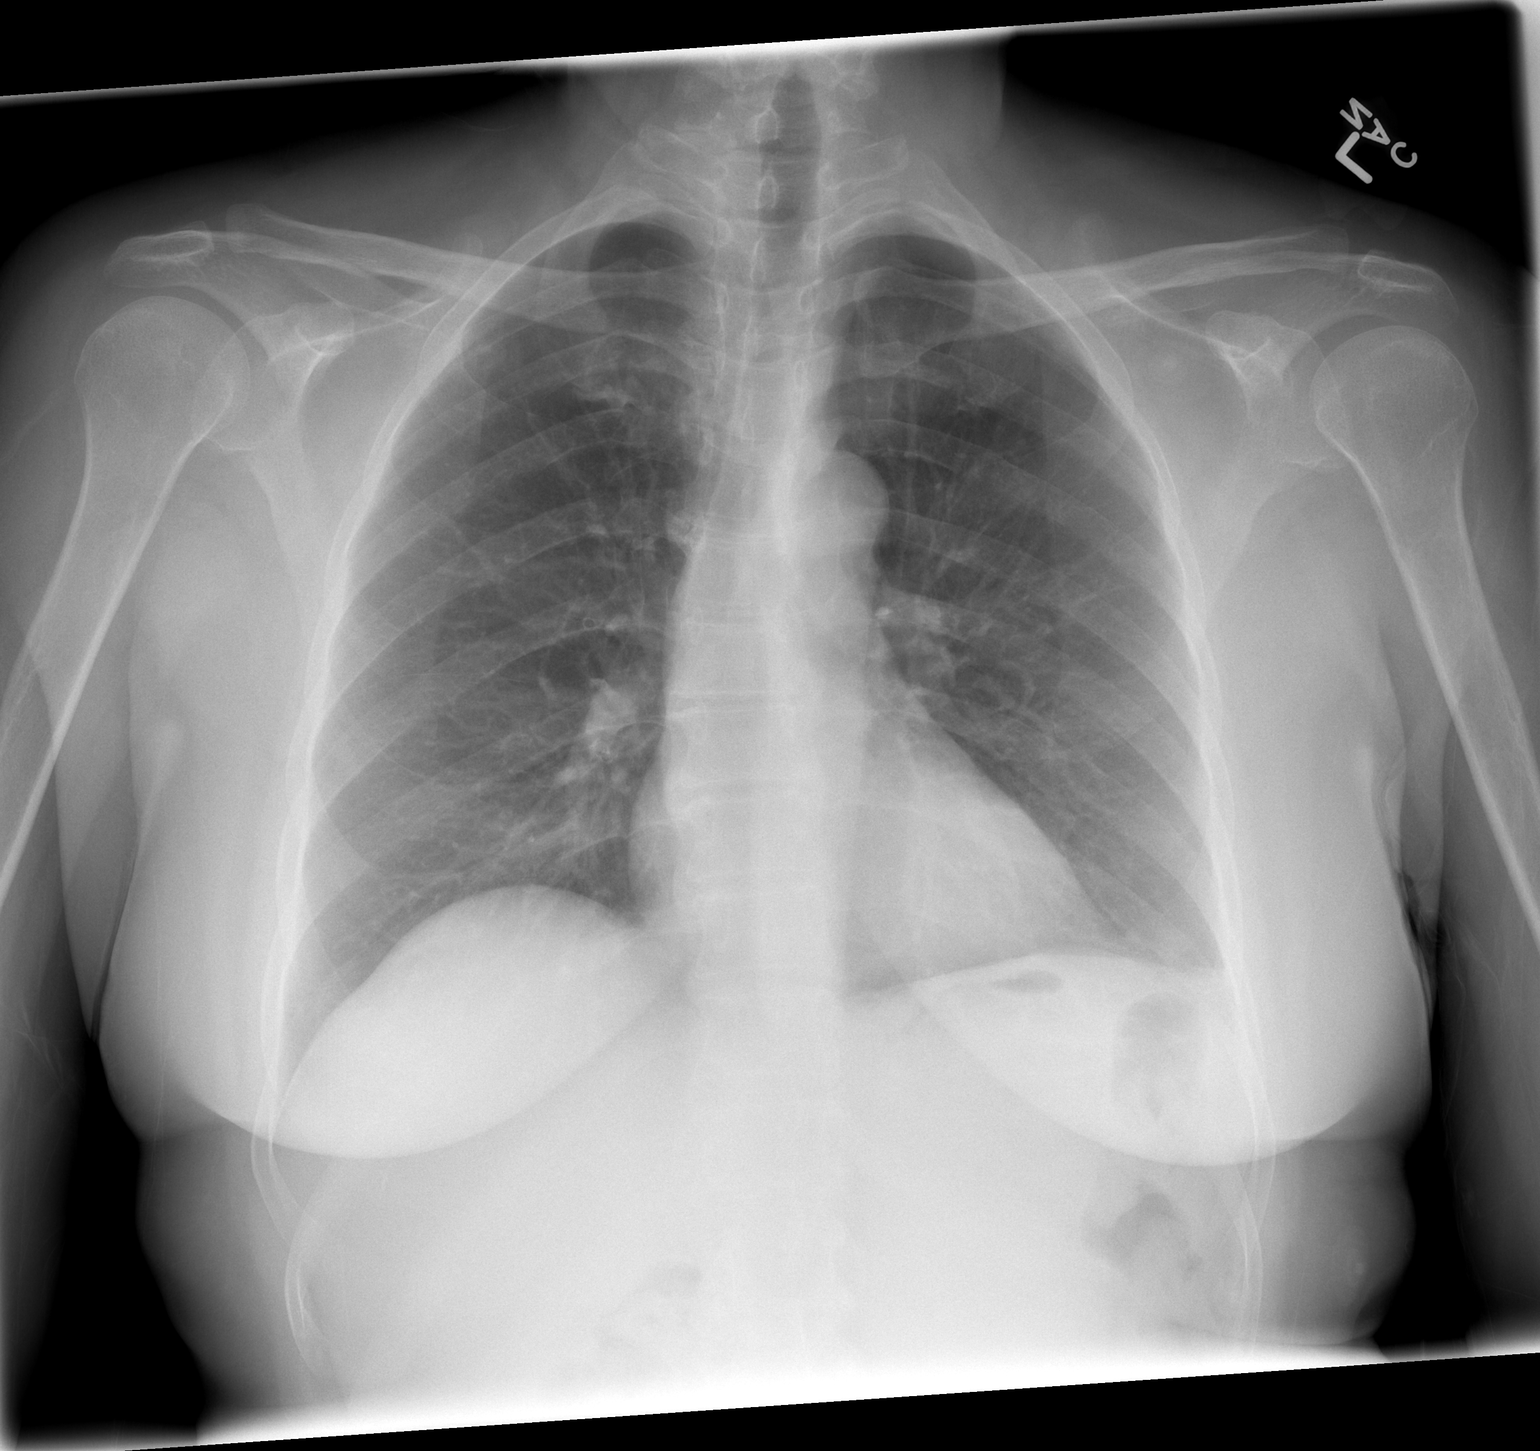

[w chest lat]
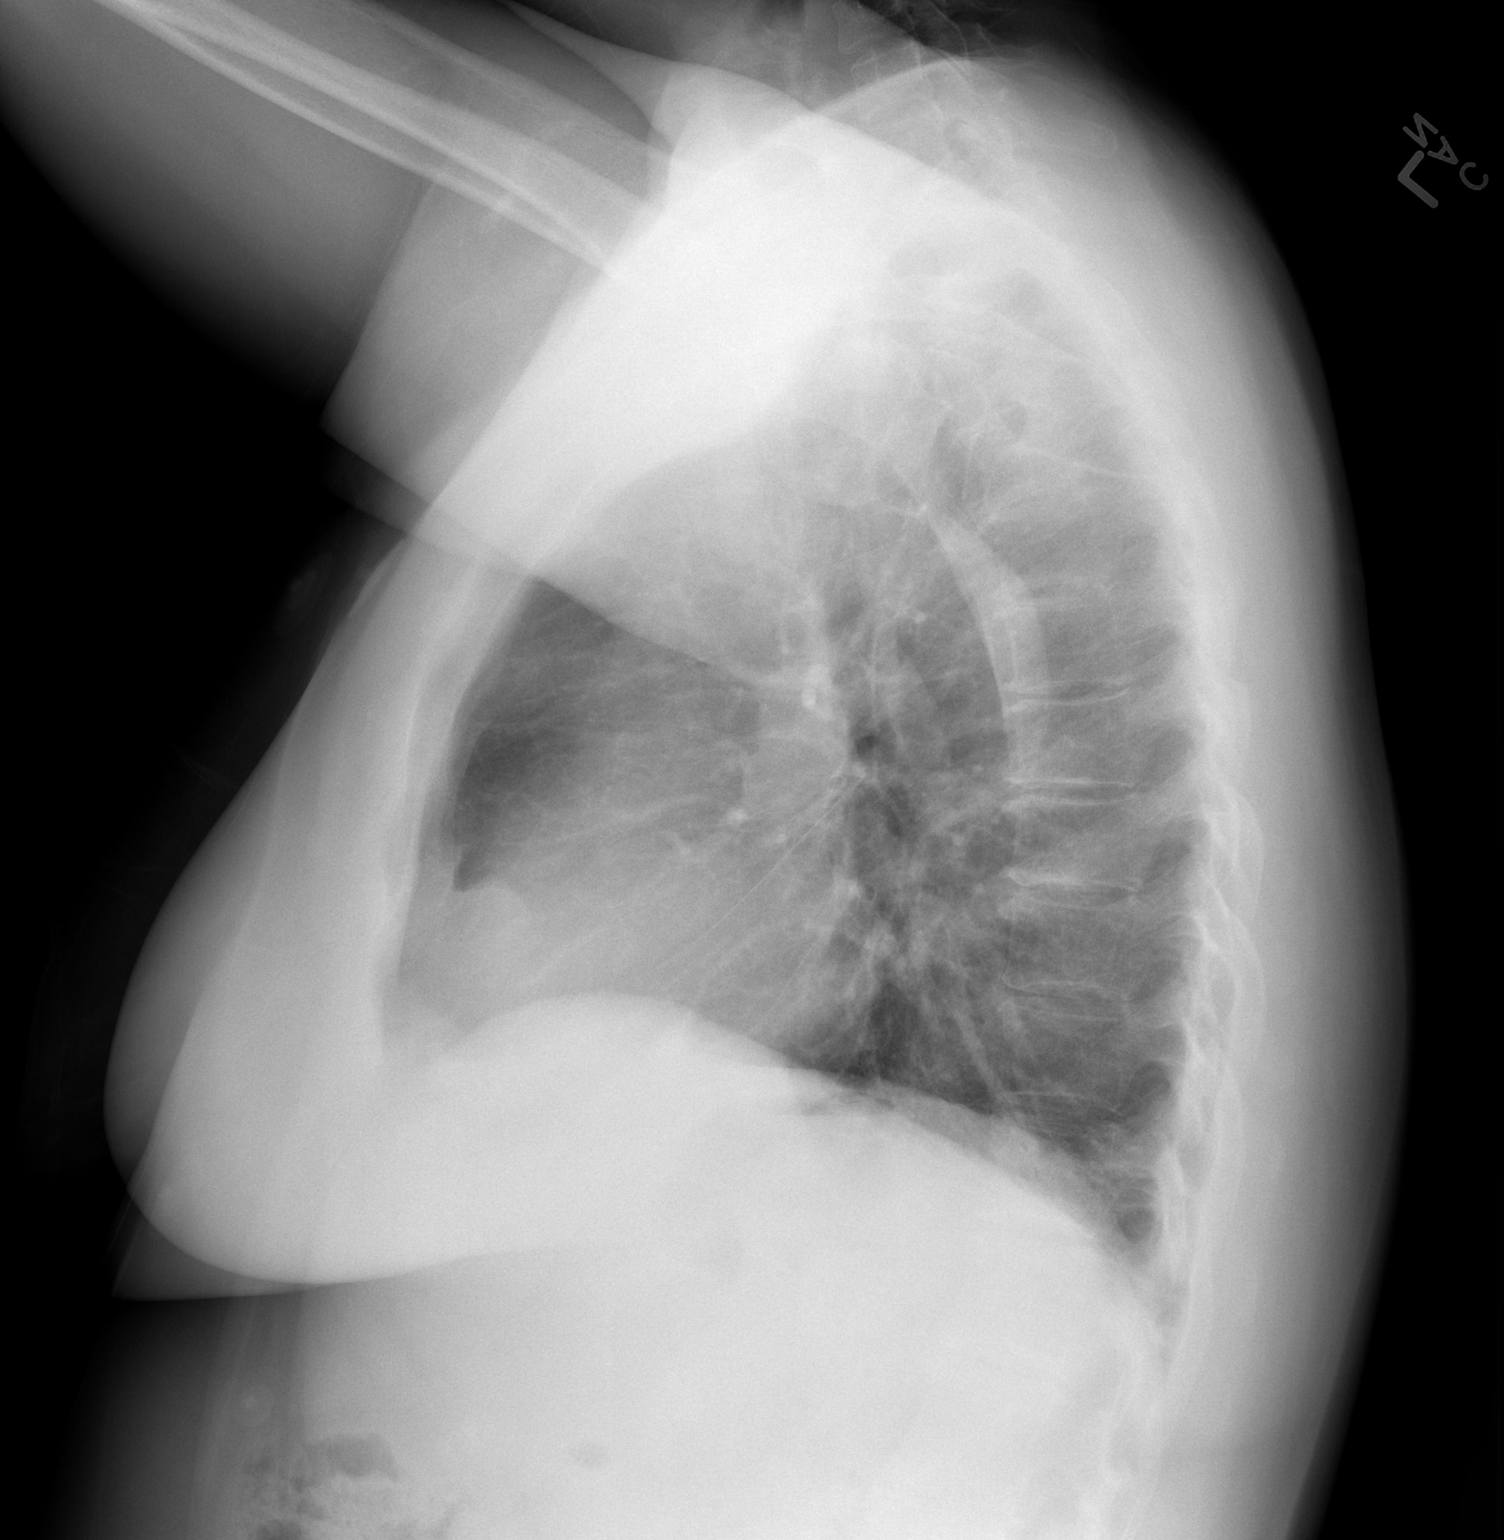

[2 of 2 positions shown; findings below may reference images not displayed]

FINDINGS: The lungs are well-aerated. Minimal left basilar opacity likely
reflects atelectasis. There is no evidence of pleural effusion or
pneumothorax.

The heart is normal in size; the mediastinal contour is within
normal limits. No acute osseous abnormalities are seen.
IMPRESSION: Minimal left basilar opacity likely reflects atelectasis; lungs
otherwise clear.
